# Patient Record
Sex: Male | Born: 1995 | Race: White | Hispanic: No | Marital: Single | Smoking: Never smoker
Health system: Southern US, Community
[De-identification: ages and names within clinical notes are randomized; demographics above are authoritative.]

---
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
## 2018-02-19 ENCOUNTER — Other Ambulatory Visit: Payer: Self-pay

## 2018-02-19 ENCOUNTER — Emergency Department: Payer: BLUE CROSS/BLUE SHIELD

## 2018-02-19 ENCOUNTER — Encounter: Payer: Self-pay | Admitting: Emergency Medicine

## 2018-02-19 ENCOUNTER — Emergency Department
Admission: EM | Admit: 2018-02-19 | Discharge: 2018-02-19 | Disposition: A | Discharge status: Home / Self Care | Payer: BLUE CROSS/BLUE SHIELD | Attending: Emergency Medicine | Admitting: Emergency Medicine

## 2018-02-19 DIAGNOSIS — M70872 Other soft tissue disorders related to use, overuse and pressure, left ankle and foot: Secondary | ICD-10-CM | POA: Insufficient documentation

## 2018-02-19 DIAGNOSIS — M7750 Other enthesopathy of unspecified foot: Secondary | ICD-10-CM

## 2018-02-19 DIAGNOSIS — M79672 Pain in left foot: Secondary | ICD-10-CM | POA: Diagnosis present

## 2018-02-19 DIAGNOSIS — Y9301 Activity, walking, marching and hiking: Secondary | ICD-10-CM | POA: Diagnosis not present

## 2018-02-19 LAB — GLUCOSE, CAPILLARY: Glucose-Capillary: 89 mg/dL (ref 70–99)

## 2018-02-19 MED ORDER — TRAMADOL HCL 50 MG PO TABS: 50.0000 mg | ORAL_TABLET | Freq: Four times a day (QID) | ORAL | 0 refills | Status: AC | PRN

## 2018-02-19 MED ORDER — NAPROXEN 500 MG PO TABS: 500.0000 mg | ORAL_TABLET | Freq: Two times a day (BID) | ORAL | 0 refills | Status: AC

## 2018-02-19 MED ORDER — NAPROXEN 500 MG PO TABS
500.0000 mg | ORAL_TABLET | Freq: Once | ORAL | Status: AC
  Administered 2018-02-19: 500 mg via ORAL
  Filled 2018-02-19: qty 1

## 2018-02-19 NOTE — ED Triage Notes (Signed)
Pt states that he has been having left foot pain since Friday and gotten worse today. Pt is unsure of how he injured his foot. Pt is ambulatory to triage with NAD.

## 2018-02-19 NOTE — ED Provider Notes (Signed)
[facility] Emergency Department Provider Note ____________________________________________  Time seen: Approximately 7:19 AM  I have reviewed the triage vital signs and the nursing notes.   HISTORY  Chief Complaint Foot Pain    HPI Joshua Decker is a 22 y.o. male who presents to the emergency department for evaluation and treatment of left foot pain that started Friday morning.  No specific injury. He has been walking more than usual while at work. No alleviating measures attempted prior to arrival. Patient has Googled his symptoms and is very concerned that he has diabetes.  History reviewed. No pertinent past medical history.  There are no active problems to display for this patient.   History reviewed. No pertinent surgical history.  Prior to Admission medications   Medication Sig Start Date End Date Taking? Authorizing Provider  naproxen (NAPROSYN) 500 MG tablet Take 1 tablet (500 mg total) by mouth 2 (two) times daily with a meal. 02/19/18   Shaleta Ruacho B, FNP  traMADol (ULTRAM) 50 MG tablet Take 1 tablet (50 mg total) by mouth every 6 (six) hours as needed. 02/19/18   Kem Boroughs B, FNP    Allergies Penicillins  No family history on file.  Social History Social History   Tobacco Use  . Smoking status: Never Smoker  . Smokeless tobacco: Never Used  Substance Use Topics  . Alcohol use: Never    Frequency: Never  . Drug use: Never    Review of Systems Constitutional: Negative for fever. Cardiovascular: Negative for chest pain. Respiratory: Negative for shortness of breath. Musculoskeletal: Positive for left foot pain. Skin: Negative for swelling or redness in toes.  Neurological: Negative for decrease in sensation  ____________________________________________   PHYSICAL EXAM:  VITAL SIGNS: ED Triage Vitals  Enc Vitals Group     BP 02/19/18 0458 (!) 143/108     Pulse Rate 02/19/18 0458 94     Resp 02/19/18 0458 18   Temp 02/19/18 0458 98.5 F (36.9 C)     Temp Source 02/19/18 0458 Oral     SpO2 02/19/18 0458 100 %     Weight 02/19/18 0455 (!) 310 lb (140.6 kg)     Height 02/19/18 0455 6\' 2"  (1.88 m)     Head Circumference --      Peak Flow --      Pain Score 02/19/18 0455 8     Pain Loc --      Pain Edu? --      Excl. in [place]? --     Constitutional: Alert and oriented. Well appearing and in no acute distress. Eyes: Conjunctivae are clear without discharge or drainage Head: Atraumatic Neck: Supple Respiratory: No cough. Respirations are even and unlabored. Musculoskeletal: No obvious deformity of the left foot/toes. Tenderness with flexion and extension of the 3-5 toes of the left foot without erythema or edema. Neurologic: Motor and sensory function is intact.  Skin: No redness or swelling of the left foot/toes.  Psychiatric: Affect and behavior are appropriate.  ____________________________________________   LABS (all labs ordered are listed, but only abnormal results are displayed)  Labs Reviewed  CBG MONITORING, ED   ____________________________________________  RADIOLOGY  X-ray of the left foot and toes shows no acute findings per radiology. ____________________________________________   PROCEDURES  .Splint Application Date/Time: 02/19/2018 7:31 AM Performed by: Chinita Pester, FNP Authorized by: Chinita Pester, FNP   Consent:    Consent obtained:  Verbal   Consent given by:  Patient   Risks  discussed:  Pain Pre-procedure details:    Sensation:  Normal Procedure details:    Laterality:  Left   Location:  Foot   Supplies:  Elastic bandage Post-procedure details:    Pain:  Unchanged   Sensation:  Normal   Patient tolerance of procedure:  Tolerated well, no immediate complications Comments:     Cast shoe applied as well.    ____________________________________________   INITIAL IMPRESSION / ASSESSMENT AND PLAN / ED COURSE  Joshua Decker is a 22 y.o. who  presents to the emergency department for treatment and evaluation of sudden onset left foot pain.  X-ray is negative for any acute abnormalities.  Patient is concerned that he has diabetes and blood sugar will be checked for reassurance.  Symptoms and exam are most consistent with tendinitis since he has been walking more than usual.  Differential may also include gout, however the toes are not red or swollen and patient has no history.  He will be treated with anti-inflammatory and an Ace bandage applied to the foot as well as a postop shoe.  Patient has his own crutches here in the room with him.  He will be given a referral to podiatry for symptoms that are not improving over the next few days.  Medications  naproxen (NAPROSYN) tablet 500 mg (has no administration in time range)    Pertinent labs & imaging results that were available during my care of the patient were reviewed by me and considered in my medical decision making (see chart for details).  _________________________________________   FINAL CLINICAL IMPRESSION(S) / ED DIAGNOSES  Final diagnoses:  Foot pain, left    ED Discharge Orders         Ordered    traMADol (ULTRAM) 50 MG tablet  Every 6 hours PRN     02/19/18 0735    naproxen (NAPROSYN) 500 MG tablet  2 times daily with meals     02/19/18 0735           If controlled substance prescribed during this visit, 12 month history viewed on the [facility] prior to issuing an initial prescription for Schedule II or III opiod.    Chinita Pesterriplett, Dominic Rhome B, FNP 02/19/18 [id number]    Governor RooksLord, Rebecca, MD 02/19/18 [phone]

## 2018-02-19 NOTE — ED Notes (Signed)
Patient given an ice pack and updated on wait time.

## 2018-02-19 NOTE — Discharge Instructions (Signed)
Follow up with the podiatrist if not improving over the next few days.  Rest, ice, and elevate your foot over the next few days.

## 2018-02-19 NOTE — ED Notes (Signed)
Left lateral anterior foot with pain that started Friday on waking. Pt denies injury. Movement and sensation intact. +2 pulse

## 2018-10-18 ENCOUNTER — Encounter: Payer: Self-pay | Admitting: *Deleted

## 2018-10-18 ENCOUNTER — Telehealth: Payer: Self-pay | Admitting: General Practice

## 2018-10-18 ENCOUNTER — Other Ambulatory Visit: Payer: Self-pay

## 2018-10-18 DIAGNOSIS — Z20822 Contact with and (suspected) exposure to covid-19: Secondary | ICD-10-CM

## 2018-10-18 NOTE — Telephone Encounter (Signed)
COVID-19 testing letter sent via [facility].

## 2018-10-18 NOTE — Telephone Encounter (Signed)
Patient would like generic nurse letter stating he was tested for covid released through his mychart. Please advise.

## 2018-10-19 LAB — NOVEL CORONAVIRUS, NAA: SARS-CoV-2, NAA: NOT DETECTED

## 2018-10-24 ENCOUNTER — Other Ambulatory Visit: Payer: Self-pay

## 2018-10-24 DIAGNOSIS — Z20822 Contact with and (suspected) exposure to covid-19: Secondary | ICD-10-CM

## 2018-10-25 LAB — NOVEL CORONAVIRUS, NAA: SARS-CoV-2, NAA: NOT DETECTED

## 2020-05-02 IMAGING — CR DG FOOT COMPLETE 3+V*L*
3 series · 3 of 3 positions shown · non-contrast
Comparison: None.

CLINICAL DATA: Initial evaluation for acute pain, no known injury.

EXAM:
LEFT FOOT - COMPLETE 3+ VIEW

[foot ap]
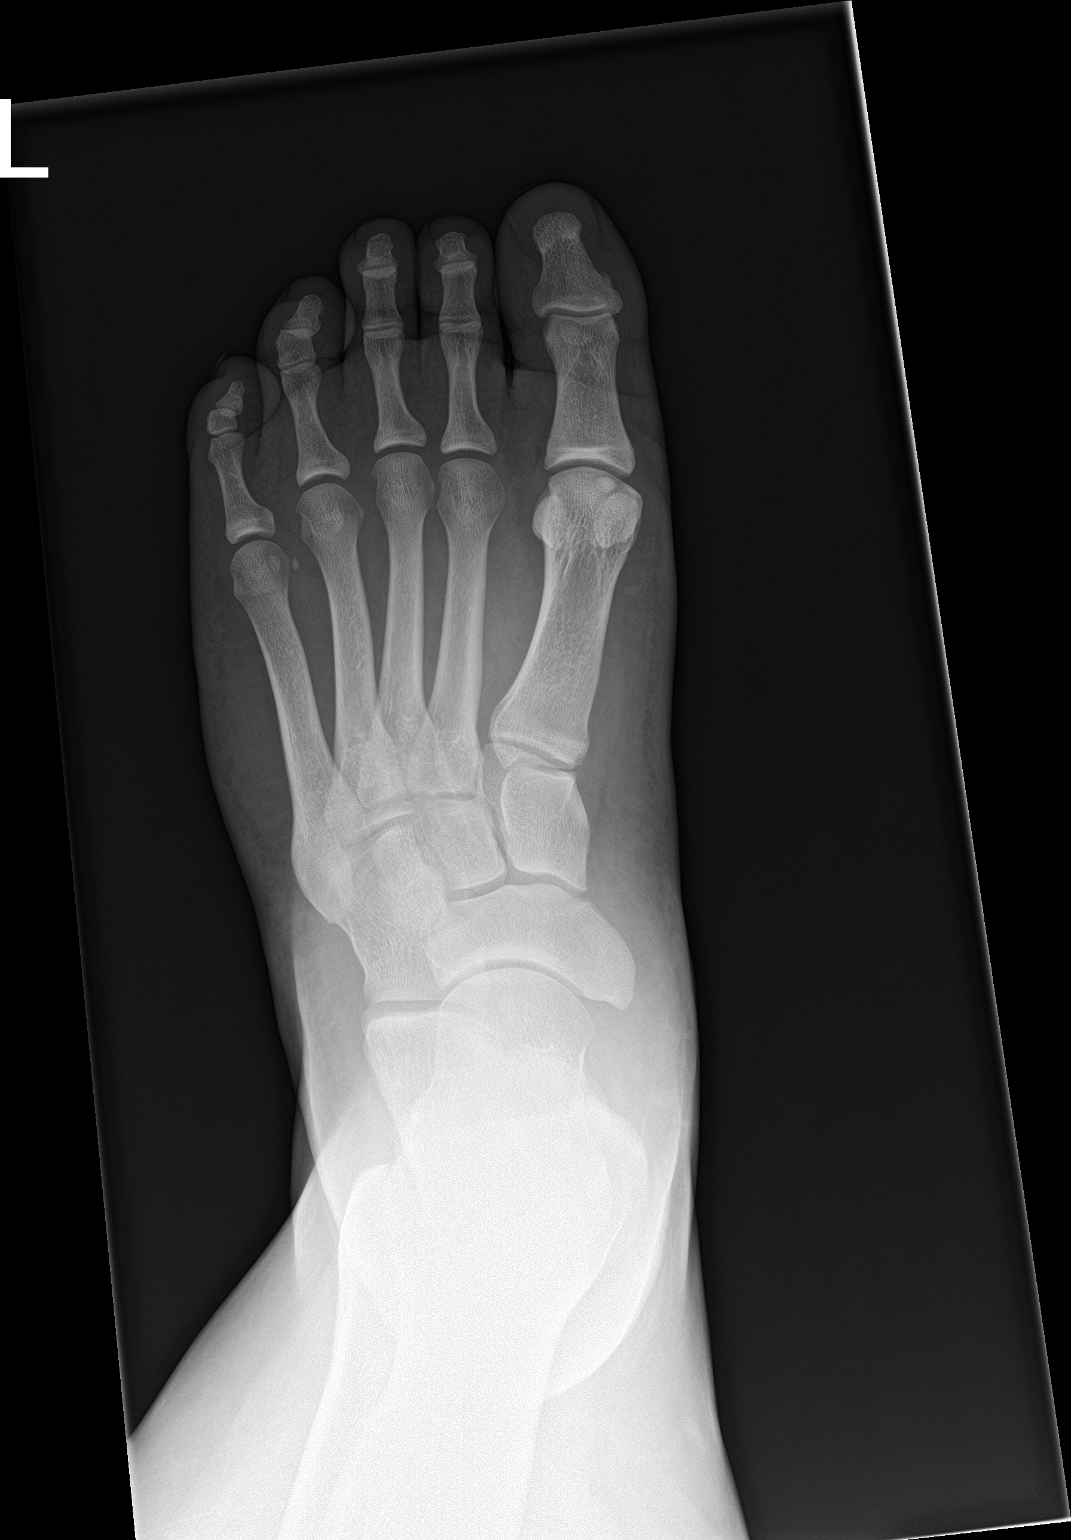

[foot obl]
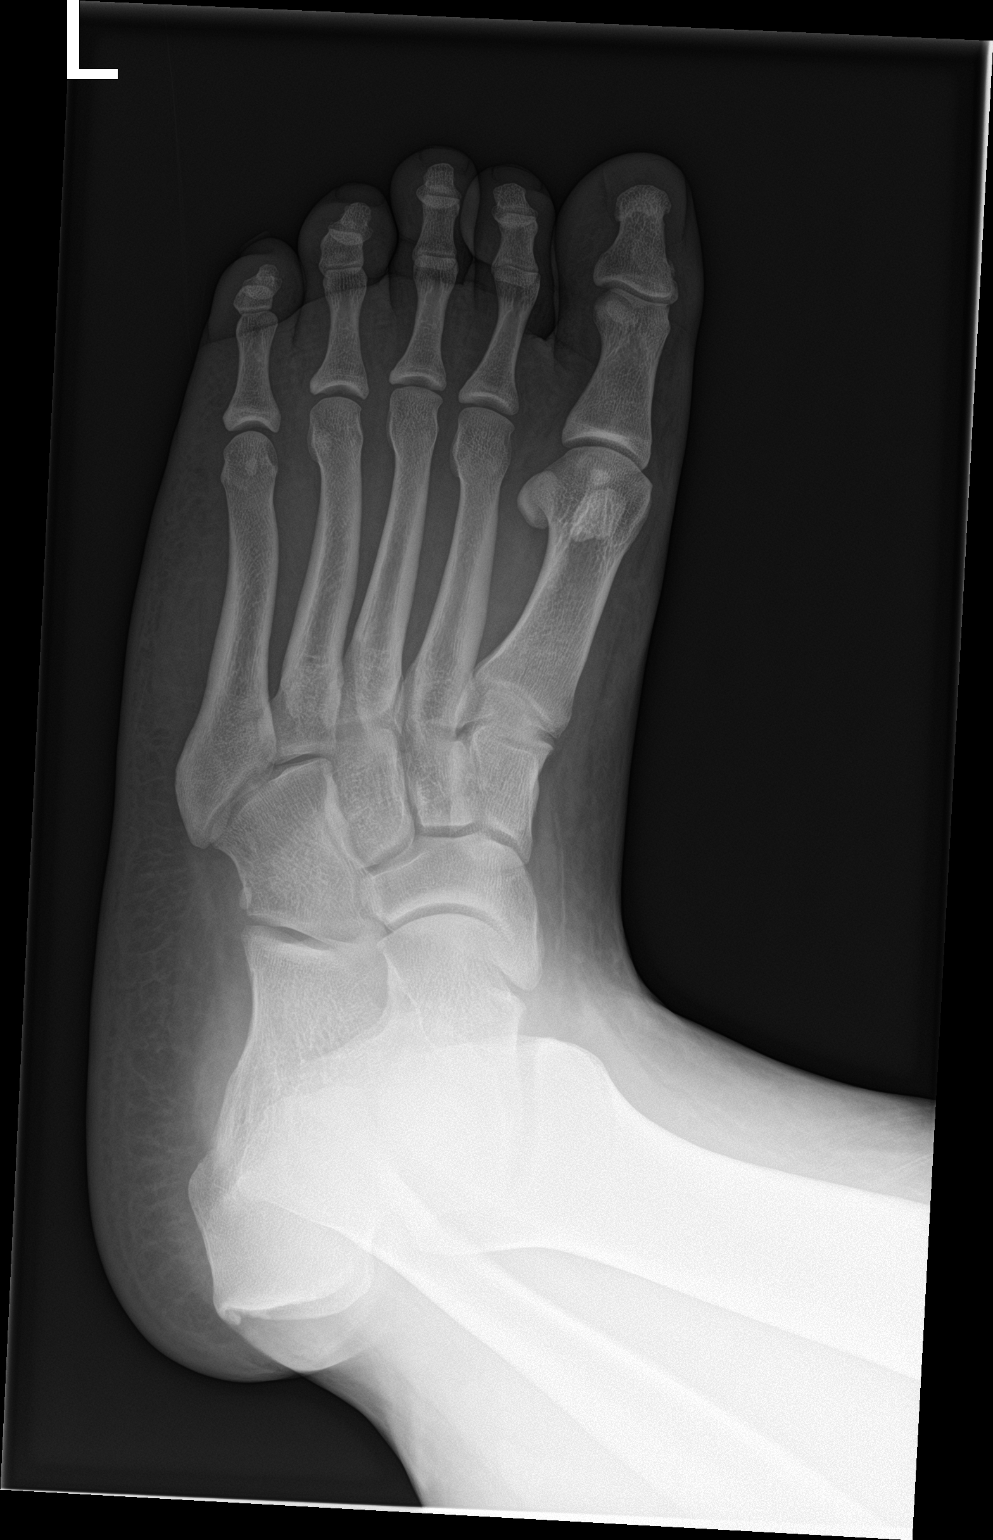

[foot lat]
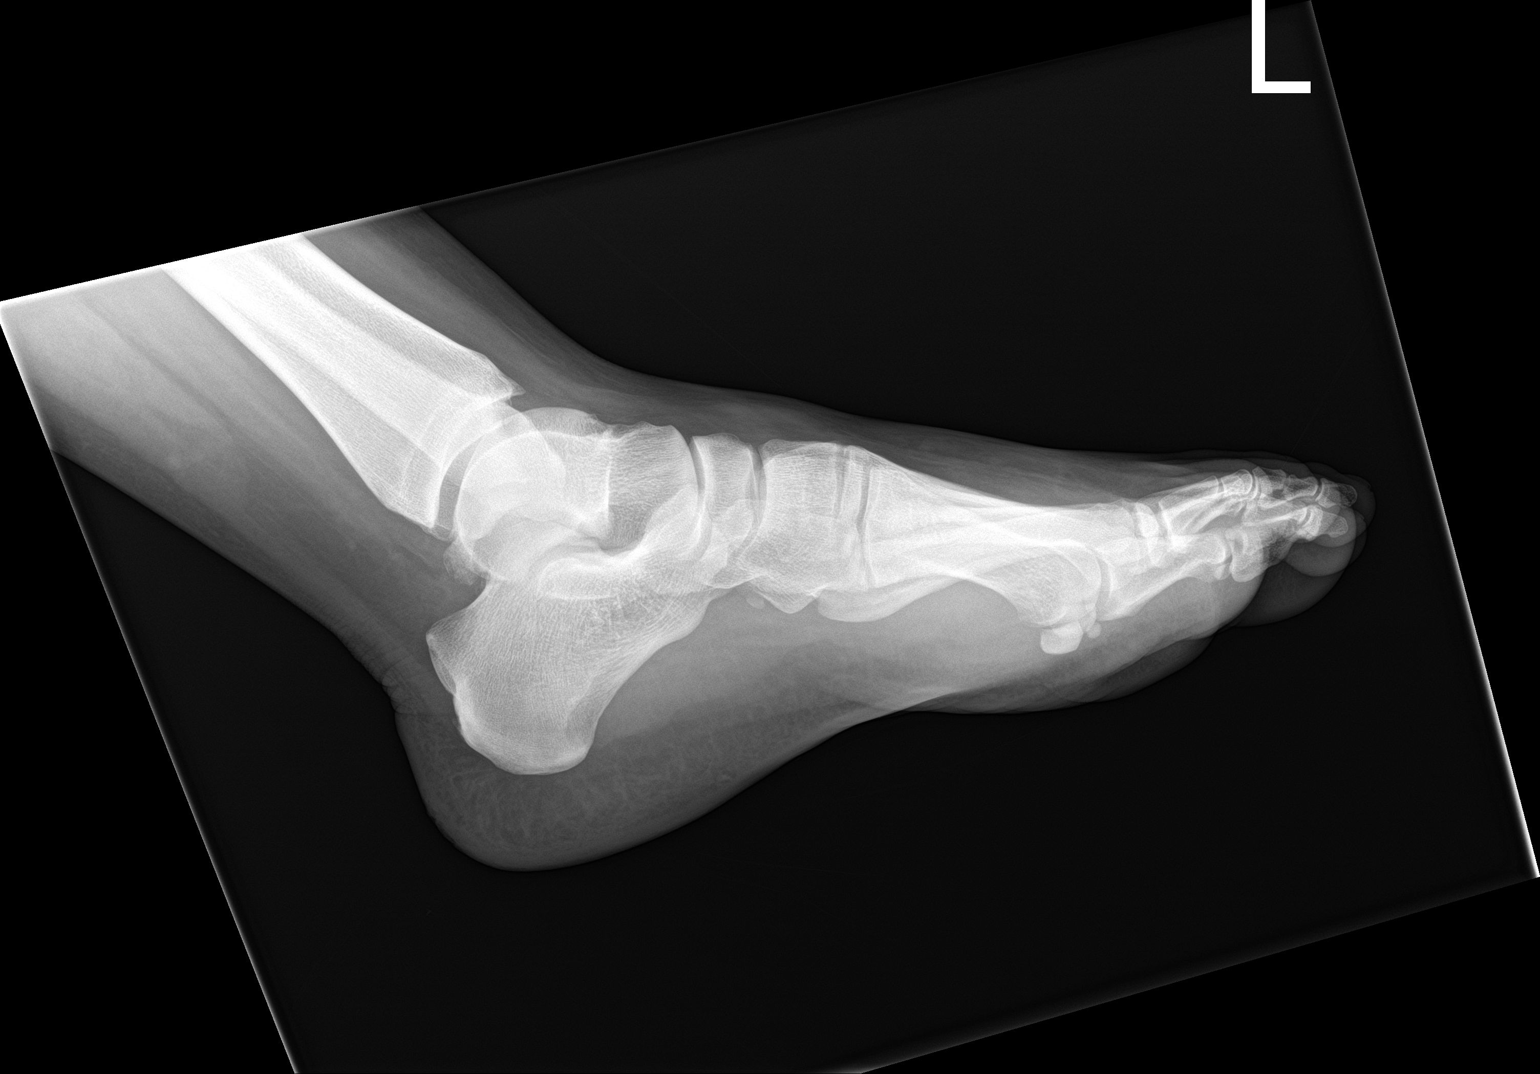

[3 of 3 positions shown; findings below may reference images not displayed]

FINDINGS: There is no evidence of fracture or dislocation. There is no
evidence of arthropathy or other focal bone abnormality. Soft
tissues are unremarkable.
IMPRESSION: Negative.

## 2021-12-27 ENCOUNTER — Ambulatory Visit
Admission: EM | Admit: 2021-12-27 | Discharge: 2021-12-27 | Disposition: A | Discharge status: Home / Self Care | Payer: Managed Care, Other (non HMO) | Attending: Urgent Care | Admitting: Urgent Care

## 2021-12-27 ENCOUNTER — Ambulatory Visit: Payer: Self-pay

## 2021-12-27 DIAGNOSIS — J029 Acute pharyngitis, unspecified: Secondary | ICD-10-CM | POA: Diagnosis not present

## 2021-12-27 DIAGNOSIS — B009 Herpesviral infection, unspecified: Secondary | ICD-10-CM | POA: Diagnosis not present

## 2021-12-27 LAB — POCT RAPID STREP A (OFFICE): Rapid Strep A Screen: NEGATIVE

## 2021-12-27 MED ORDER — VALACYCLOVIR HCL 1 G PO TABS: 1000.0000 mg | ORAL_TABLET | Freq: Two times a day (BID) | ORAL | 0 refills | Status: AC

## 2021-12-27 NOTE — Discharge Instructions (Addendum)
Follow up here or with your primary care provider if your symptoms are worsening or not improving with treatment.     

## 2021-12-27 NOTE — ED Provider Notes (Signed)
  [facility]    CSN: [id number] Arrival date & time: 12/27/21  1155      History   Chief Complaint No chief complaint on file.   HPI Joshua Decker is a 26 y.o. male.   HPI  Presents to UC with complaint of sore throat x 2 days.  No past medical history on file.  There are no problems to display for this patient.   No past surgical history on file.     Home Medications    Prior to Admission medications   Medication Sig Start Date End Date Taking? Authorizing Provider  naproxen (NAPROSYN) 500 MG tablet Take 1 tablet (500 mg total) by mouth 2 (two) times daily with a meal. 02/19/18   Triplett, Cari B, FNP  traMADol (ULTRAM) 50 MG tablet Take 1 tablet (50 mg total) by mouth every 6 (six) hours as needed. 02/19/18   Victorino Dike, FNP    Family History No family history on file.  Social History Social History   Tobacco Use  . Smoking status: Never  . Smokeless tobacco: Never  Substance Use Topics  . Alcohol use: Never  . Drug use: Never     Allergies   Penicillins   Review of Systems Review of Systems   Physical Exam Triage Vital Signs ED Triage Vitals  Enc Vitals Group     BP      Pulse      Resp      Temp      Temp src      SpO2      Weight      Height      Head Circumference      Peak Flow      Pain Score      Pain Loc      Pain Edu?      Excl. in [place]?    No data found.  Updated Vital Signs There were no vitals taken for this visit.  Visual Acuity Right Eye Distance:   Left Eye Distance:   Bilateral Distance:    Right Eye Near:   Left Eye Near:    Bilateral Near:     Physical Exam HENT:     Mouth/Throat:     UC Treatments / Results  Labs (all labs ordered are listed, but only abnormal results are displayed) Labs Reviewed - No data to display  EKG   Radiology No results found.  Procedures Procedures (including critical care time)  Medications Ordered in UC Medications - No data to  display  Initial Impression / Assessment and Plan / UC Course  I have reviewed the triage vital signs and the nursing notes.  Pertinent labs & imaging results that were available during my care of the patient were reviewed by me and considered in my medical decision making (see chart for details).   But strep is negative.  Patient with 4+ tonsils without peritonsillar exudates.  A herpetic appearing lesion is observed on the right side of the patient's soft palate.  Will treat with valacyclovir and asked the patient to follow-up with his primary care provider if symptoms do not respond to treatment.   Final Clinical Impressions(s) / UC Diagnoses   Final diagnoses:  None   Discharge Instructions   None    ED Prescriptions   None    PDMP not reviewed this encounter.   Rose Phi, [place] 12/27/21 1234

## 2021-12-27 NOTE — ED Triage Notes (Signed)
Pt. Presents to UC w/ a sore throat for the past 2 days. Pt has been treating himself w/ OTC medications and no relief.
